# Patient Record
Sex: Female | Born: 1970 | Race: White | Hispanic: No | Marital: Married | Smoking: Never smoker
Health system: Southern US, Community
[De-identification: ages and names within clinical notes are randomized; demographics above are authoritative.]

---

## 2012-06-15 ENCOUNTER — Ambulatory Visit: Payer: Self-pay | Admitting: Family Medicine

## 2013-08-12 ENCOUNTER — Ambulatory Visit: Payer: Self-pay | Admitting: Internal Medicine

## 2014-08-28 ENCOUNTER — Ambulatory Visit
Admission: RE | Admit: 2014-08-28 | Discharge: 2014-08-28 | Disposition: A | Discharge status: Home / Self Care | Payer: BLUE CROSS/BLUE SHIELD | Source: Ambulatory Visit | Attending: Family Medicine | Admitting: Family Medicine

## 2014-08-28 ENCOUNTER — Other Ambulatory Visit: Payer: Self-pay | Admitting: Family Medicine

## 2014-08-28 DIAGNOSIS — M79671 Pain in right foot: Secondary | ICD-10-CM | POA: Diagnosis present

## 2014-08-28 DIAGNOSIS — M79674 Pain in right toe(s): Secondary | ICD-10-CM

## 2014-12-01 ENCOUNTER — Other Ambulatory Visit
Admission: RE | Admit: 2014-12-01 | Discharge: 2014-12-01 | Disposition: A | Discharge status: Home / Self Care | Payer: BLUE CROSS/BLUE SHIELD | Source: Ambulatory Visit | Attending: Gastroenterology | Admitting: Gastroenterology

## 2014-12-29 ENCOUNTER — Ambulatory Visit
Admission: RE | Admit: 2014-12-29 | Payer: BLUE CROSS/BLUE SHIELD | Source: Ambulatory Visit | Admitting: Gastroenterology

## 2014-12-29 ENCOUNTER — Encounter: Admission: RE | Payer: Self-pay | Source: Ambulatory Visit

## 2014-12-29 SURGERY — ESOPHAGOGASTRODUODENOSCOPY (EGD) WITH PROPOFOL
Anesthesia: General

## 2015-01-02 ENCOUNTER — Encounter: Payer: Self-pay | Admitting: Sports Medicine

## 2015-01-02 ENCOUNTER — Ambulatory Visit (INDEPENDENT_AMBULATORY_CARE_PROVIDER_SITE_OTHER): Payer: BLUE CROSS/BLUE SHIELD | Admitting: Sports Medicine

## 2015-01-02 DIAGNOSIS — M2021 Hallux rigidus, right foot: Secondary | ICD-10-CM

## 2015-01-02 DIAGNOSIS — M79672 Pain in left foot: Secondary | ICD-10-CM

## 2015-01-02 DIAGNOSIS — M79671 Pain in right foot: Secondary | ICD-10-CM

## 2015-01-02 DIAGNOSIS — M2022 Hallux rigidus, left foot: Secondary | ICD-10-CM | POA: Diagnosis not present

## 2015-01-02 MED ORDER — TRIAMCINOLONE ACETONIDE 10 MG/ML IJ SUSP: 10.0000 mg | Freq: Once | INTRAMUSCULAR | Status: AC

## 2015-01-02 MED ORDER — MELOXICAM 15 MG PO TABS: 15.0000 mg | ORAL_TABLET | Freq: Every day | ORAL | Status: AC

## 2015-01-02 NOTE — Progress Notes (Signed)
Patient ID: Stephanie Robinson, female   DOB: 1970/12/14, 44 y.o.   MRN: [id number] Subjective: Stephanie Robinson is a 44 y.o. female patient who presents to office for evaluation of Right> Left foot pain. Patient complains of progressive pain especially over the last 5 years in the Right>Left foot at the big toe joint that radiates to the top and sometimes swell and turns red.  Patient has tried change in shoes with no relief in symptoms. Patient denies any other pedal complaints. Denies injury/trip/fall/sprain/any causative factors.   Review of Systems  All other systems reviewed and are negative.  There are no active problems to display for this patient.  No current outpatient prescriptions on file prior to visit.   No current facility-administered medications on file prior to visit.   Allergies  Allergen Reactions  . Peanut Oil Shortness Of Breath and Anaphylaxis    Chest pain  . Allyl Isothiocyanate Other (See Comments)  . Meperidine     Other reaction(s): SHORTNESS OF BREATH  . Wheat Bran Other (See Comments)  . Aspirin Rash and Other (See Comments)     Objective:  General: Alert and oriented x3 in no acute distress  Dermatology: No open lesions bilateral lower extremities, no webspace macerations, no ecchymosis bilateral, all nails x 10 are well manicured.  Vascular: Dorsalis Pedis and Posterior Tibial pedal pulses 2/4, Capillary Fill Time 3 seconds,(+) pedal hair growth bilateral, no erythema, no edema bilateral lower extremities, Temperature gradient within normal limits.  Neurology: Gross sensation intact via light touch bilateral. No Tinels sign.   Musculoskeletal: Mild tenderness with palpation at 1st MTPJ and anterior joint line on Right>Left with 10 deg df and pf on right and 25 deg df and 10 deg pf on left, no crepitus, all other joints within normal limits. No pain with calf compression bilateral.Strength within normal limits in all groups bilateral.    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 If works well will make orthotics.  -Patient to return to office in 4 weeks  or sooner if condition worsens.  Landis Martins, DPM

## 2015-01-02 NOTE — Patient Instructions (Signed)
Hallux Rigidus Hallux rigidus is a condition involving pain and a loss of motion of the first (big) toe. The pain gets worse with lifting up (extension) of the toe. This is usually due to arthritic bony bumps (spurring) of the joint at the base of the big toe.  SYMPTOMS   Pain, with lifting up of the toe.  Tenderness over the joint where the big toe meets the foot.  Redness, swelling, and warmth over the top of the base of the big toe (sometimes).  Foot pain, stiffness, and limping. CAUSES  Hallux rigidus is caused by arthritis of the joint where the big toe meets the foot. The arthritis creates a bone spur that pinches the soft tissues when the toe is extended. RISK INCREASES WITH:  Tight shoes with a narrow toe box.  Family history of foot problems.  Gout and rheumatoid and psoriatic arthritis.  History of previous toe injury, including "turf toe."  Long first toe, flat feet, and other big toe bony bumps.  Arthritis of the big toe. PREVENTION   Wear wide-toed shoes that fit well.  Tape the big toe to reduce motion and to prevent pinching of the tissues between the bone.  Maintain physical fitness:  Foot and ankle flexibility.  Muscle strength and endurance. PROGNOSIS  This condition can usually be managed with proper treatment. However, surgery is typically required to prevent the problem from recurring.  RELATED COMPLICATIONS  Injury to other areas of the foot or ankle, caused by abnormal walking in an attempt to avoid the pain felt when walking normally. TREATMENT Treatment first involves stopping the activities that aggravate your symptoms. Ice and medicine can be used to reduce the pain and inflammation. Modifications to shoes may help reduce pain, including wearing stiff-soled shoes, shoes with a wide toe box, inserting a padded donut to relieve pressure on top of the joint, or wearing an arch support. Corticosteroid injections may be given to reduce inflammation. If  nonsurgical treatment is unsuccessful, surgery may be needed. Surgical options include removing the arthritic bony spur, cutting a bone in the foot to change the arc of motion (allowing the toe to extend more), or fusion of the joint (eliminating all motion in the joint at the base of the big toe).  MEDICATION   If pain medicine is needed, nonsteroidal anti-inflammatory medicines (aspirin and ibuprofen), or other minor pain relievers (acetaminophen), are often advised.  Do not take pain medicine for 7 days before surgery.  Prescription pain relievers are usually prescribed only after surgery. Use only as directed and only as much as you need.  Ointments for arthritis, applied to the skin, may give some relief.  Injections of corticosteroids may be given to reduce inflammation. HEAT AND COLD  Cold treatment (icing) relieves pain and reduces inflammation. Cold treatment should be applied for 10 to 15 minutes every 2 to 3 hours, and immediately after activity that aggravates your symptoms. Use ice packs or an ice massage.  Heat treatment may be used before performing the stretching and strengthening activities prescribed by your caregiver, physical therapist, or athletic trainer. Use a heat pack or a warm water soak. SEEK MEDICAL CARE IF:   Symptoms get worse or do not improve in 2 weeks, despite treatment.  After surgery you develop fever, increasing pain, redness, swelling, drainage of fluids, bleeding, or increasing warmth.  New, unexplained symptoms develop. (Drugs used in treatment may produce side effects.)   This information is not intended to replace advice given to  you by your health care provider. Make sure you discuss any questions you have with your health care provider.   Document Released: 12/30/2004 Document Revised: 01/20/2014 Document Reviewed: 04/13/2008 Elsevier Interactive Patient Education [employer].

## 2015-01-02 NOTE — Progress Notes (Deleted)
   Subjective:    Patient ID: Stephanie Robinson, female    DOB: 1970-06-17, 44 y.o.   MRN: [id number]  HPI    Review of Systems  All other systems reviewed and are negative.      Objective:   Physical Exam        Assessment & Plan:

## 2015-01-30 ENCOUNTER — Ambulatory Visit (INDEPENDENT_AMBULATORY_CARE_PROVIDER_SITE_OTHER): Payer: Managed Care, Other (non HMO) | Admitting: Sports Medicine

## 2015-01-30 ENCOUNTER — Encounter: Payer: Self-pay | Admitting: Sports Medicine

## 2015-01-30 DIAGNOSIS — M79671 Pain in right foot: Secondary | ICD-10-CM | POA: Diagnosis not present

## 2015-01-30 DIAGNOSIS — M2021 Hallux rigidus, right foot: Secondary | ICD-10-CM | POA: Diagnosis not present

## 2015-01-30 DIAGNOSIS — M2022 Hallux rigidus, left foot: Secondary | ICD-10-CM

## 2015-01-30 DIAGNOSIS — M79672 Pain in left foot: Secondary | ICD-10-CM

## 2015-01-30 NOTE — Progress Notes (Signed)
Patient ID: Stephanie Robinson, female   DOB: 1970-09-30, 45 y.o.   MRN: [id number]  Subjective: Stephanie Robinson is a 45 y.o. female patient who returns to office for follow up evaluation of Right> Left foot pain. Patient states the injection helped a lot and her pain feels all gone in the right side. Patient reports that the padding was also helpful. Still has another week of Meloxicam to finish. Patient denies any other pedal complaints. .   There are no active problems to display for this patient.  Current Outpatient Prescriptions on File Prior to Visit  Medication Sig Dispense Refill  . Cholecalciferol (VITAMIN D) 2000 UNITS tablet Take 2,000 Units by mouth daily.    . meloxicam (MOBIC) 15 MG tablet Take 1 tablet (15 mg total) by mouth daily. 30 tablet 0  . sertraline (ZOLOFT) 100 MG tablet Take 200 mg by mouth daily.     Current Facility-Administered Medications on File Prior to Visit  Medication Dose Route Frequency Provider Last Rate Last Dose  . triamcinolone acetonide (KENALOG) 10 MG/ML injection 10 mg  10 mg Other Once Landis Martins, DPM       Allergies  Allergen Reactions  . Peanut Oil Shortness Of Breath and Anaphylaxis    Chest pain  . Allyl Isothiocyanate Other (See Comments)  . Meperidine     Other reaction(s): SHORTNESS OF BREATH  . Wheat Bran Other (See Comments)  . Aspirin Rash and Other (See Comments)     Objective:  General: Alert and oriented x3 in no acute distress  Dermatology: No open lesions bilateral lower extremities, no webspace macerations, no ecchymosis bilateral, all nails x 10 are well manicured.  Vascular: Dorsalis Pedis and Posterior Tibial pedal pulses 2/4, Capillary Fill Time 3 seconds,(+) pedal hair growth bilateral, no erythema, no edema bilateral lower extremities, Temperature gradient within normal limits.  Neurology: Gross sensation intact via light touch bilateral. No Tinels sign.   Musculoskeletal: No tenderness with palpation at 1st  MTPJ and anterior joint line on Right>Left with 10 deg df and pf on right and 25 deg df and 10 deg pf on left, no crepitus, all other joints within normal limits. No pain with calf compression bilateral.Strength within normal limits in all groups bilateral.    Assessment and Plan: Problem List Items Addressed This Visit    None    Visit Diagnoses    Foot pain, bilateral    -  Primary    Hallux rigidus of both feet        R>L, much improved       -Complete examination performed. -Discussed treatement options for hallux rigidus and long term care. -Patient to complete Meloxicam 15 mg po daily with food as Rx. -Advised patient to monitor symptoms if recur to return for re-eval. -Recommend ice and elevation as needed. -Recommended good supportive shoes and OTC inserts. If fail to resolve symptoms will make custom orthotics for patient.  -Patient to return to office as needed or sooner if condition worsens.  Landis Martins, DPM

## 2015-05-29 ENCOUNTER — Other Ambulatory Visit: Payer: Self-pay | Admitting: Family Medicine

## 2015-05-29 DIAGNOSIS — R1011 Right upper quadrant pain: Secondary | ICD-10-CM

## 2015-05-31 ENCOUNTER — Ambulatory Visit
Admission: RE | Admit: 2015-05-31 | Discharge: 2015-05-31 | Disposition: A | Discharge status: Home / Self Care | Payer: Managed Care, Other (non HMO) | Source: Ambulatory Visit | Attending: Family Medicine | Admitting: Family Medicine

## 2015-05-31 DIAGNOSIS — R1011 Right upper quadrant pain: Secondary | ICD-10-CM

## 2015-05-31 DIAGNOSIS — K760 Fatty (change of) liver, not elsewhere classified: Secondary | ICD-10-CM | POA: Insufficient documentation

## 2019-04-08 ENCOUNTER — Ambulatory Visit: Payer: BLUE CROSS/BLUE SHIELD | Attending: Internal Medicine

## 2019-04-08 DIAGNOSIS — Z23 Encounter for immunization: Secondary | ICD-10-CM

## 2019-04-08 NOTE — Progress Notes (Signed)
   Covid-19 Vaccination Clinic  Name:  Alyxandra Tenbrink    MRN: [id number] DOB: 1970-08-13  04/08/2019  Ms. Buchta was observed post Covid-19 immunization for 15 minutes without incident. She was provided with Vaccine Information Sheet and instruction to access the V-Safe system.   Ms. Cansler was instructed to call 911 with any severe reactions post vaccine: Marland Kitchen Difficulty breathing  . Swelling of face and throat  . A fast heartbeat  . A bad rash all over body  . Dizziness and weakness   Immunizations Administered    Name Date Dose VIS Date Route   Pfizer COVID-19 Vaccine 04/08/2019  8:24 AM 0.3 mL 12/24/2018 Intramuscular   Manufacturer: [place]   Lot: [id number]   [place]: [id number]

## 2019-05-03 ENCOUNTER — Ambulatory Visit: Payer: BLUE CROSS/BLUE SHIELD | Attending: Internal Medicine

## 2019-05-03 DIAGNOSIS — Z23 Encounter for immunization: Secondary | ICD-10-CM

## 2019-05-03 NOTE — Progress Notes (Signed)
   Covid-19 Vaccination Clinic  Name:  Stephanie Robinson    MRN: [id number] DOB: 02-16-1970  05/03/2019  Stephanie Robinson was observed post Covid-19 immunization for 30 minutes based on pre-vaccination screening without incident. She was provided with Vaccine Information Sheet and instruction to access the V-Safe system.   Stephanie Robinson was instructed to call 911 with any severe reactions post vaccine: Marland Kitchen Difficulty breathing  . Swelling of face and throat  . A fast heartbeat  . A bad rash all over body  . Dizziness and weakness   Immunizations Administered    Name Date Dose VIS Date Route   Pfizer COVID-19 Vaccine 05/03/2019  8:45 AM 0.3 mL 03/09/2018 Intramuscular   Manufacturer: [employer]   Lot: [id number]   [place]: [address]-1

## 2020-07-27 ENCOUNTER — Emergency Department: Payer: Managed Care, Other (non HMO)

## 2020-07-27 ENCOUNTER — Other Ambulatory Visit: Payer: Self-pay

## 2020-07-27 ENCOUNTER — Emergency Department
Admission: EM | Admit: 2020-07-27 | Discharge: 2020-07-27 | Disposition: A | Discharge status: Home / Self Care | Payer: Managed Care, Other (non HMO) | Attending: Emergency Medicine | Admitting: Emergency Medicine

## 2020-07-27 DIAGNOSIS — Y92003 Bedroom of unspecified non-institutional (private) residence as the place of occurrence of the external cause: Secondary | ICD-10-CM | POA: Insufficient documentation

## 2020-07-27 DIAGNOSIS — S8391XA Sprain of unspecified site of right knee, initial encounter: Secondary | ICD-10-CM | POA: Insufficient documentation

## 2020-07-27 DIAGNOSIS — X501XXA Overexertion from prolonged static or awkward postures, initial encounter: Secondary | ICD-10-CM | POA: Insufficient documentation

## 2020-07-27 DIAGNOSIS — M25561 Pain in right knee: Secondary | ICD-10-CM | POA: Insufficient documentation

## 2020-07-27 DIAGNOSIS — S8991XA Unspecified injury of right lower leg, initial encounter: Secondary | ICD-10-CM | POA: Diagnosis present

## 2020-07-27 MED ORDER — IBUPROFEN 800 MG PO TABS: 800.0000 mg | ORAL_TABLET | Freq: Three times a day (TID) | ORAL | 0 refills | Status: AC | PRN

## 2020-07-27 MED ORDER — IBUPROFEN 800 MG PO TABS
800.0000 mg | ORAL_TABLET | Freq: Once | ORAL | Status: AC
  Administered 2020-07-27: 800 mg via ORAL
  Filled 2020-07-27: qty 1

## 2020-07-27 NOTE — ED Triage Notes (Signed)
Pt states her knee "popped" out of joint tonight, hx of the same but it usually resolves on its own. Tonight was laying in bed and rolled over, EMS gave fentanyl and splinted left leg.

## 2020-07-27 NOTE — ED Provider Notes (Signed)
[facility] Emergency Department Provider Note  ____________________________________________  Time seen: Approximately 2:44 AM  I have reviewed the triage vital signs and the nursing notes.   HISTORY  Chief Complaint Knee Injury   HPI Stephanie Robinson is a 50 y.o. female with a history of right meniscus tear status postsurgical repair who presents for evaluation of right knee pain.  Patient reports that she was turning in bed this evening when she felt something pop in her knee.  She looked at it and it looked abnormal and swollen.  She had severe pain and was not able to bear weight.  She does report pops and clicks that are painful in her knee but those usually resolve within a few minutes.  She received IV fentanyl for per EMS and reports that her pain is mostly resolved at this time.  PMH R knee meniscus tear  Prior to Admission medications   Medication Sig Start Date End Date Taking? Authorizing Provider  ibuprofen (ADVIL) 800 MG tablet Take 1 tablet (800 mg total) by mouth every 8 (eight) hours as needed. 07/27/20  Yes Aryonna Gunnerson, [place], MD  Cholecalciferol (VITAMIN D) 2000 UNITS tablet Take 2,000 Units by mouth daily.    [provider]  meloxicam (MOBIC) 15 MG tablet Take 1 tablet (15 mg total) by mouth daily. 01/02/15   Landis Martins, DPM  sertraline (ZOLOFT) 100 MG tablet Take 200 mg by mouth daily.    [provider]    Allergies Peanut oil, Allyl isothiocyanate, Meperidine, Sulfa antibiotics, Wheat bran, and Aspirin  No family history on file.  Social History Social History   Tobacco Use   Smoking status: Never   Smokeless tobacco: Never    Review of Systems  Constitutional: Negative for fever. Eyes: Negative for visual changes. ENT: Negative for sore throat. Neck: No neck pain  Cardiovascular: Negative for chest pain. Respiratory: Negative for shortness of breath. Gastrointestinal: Negative for abdominal  pain, vomiting or diarrhea. Genitourinary: Negative for dysuria. Musculoskeletal: Negative for back pain. + R knee pain Skin: Negative for rash. Neurological: Negative for headaches, weakness or numbness. Psych: No SI or HI  ____________________________________________   PHYSICAL EXAM:  VITAL SIGNS: ED Triage Vitals  Enc Vitals Group     BP 07/27/20 0109 114/69     Pulse Rate 07/27/20 0109 65     Resp 07/27/20 0109 20     Temp 07/27/20 0109 98.3 F (36.8 C)     Temp Source 07/27/20 0109 Oral     SpO2 07/27/20 0109 98 %     Weight 07/27/20 0105 230 lb (104.3 kg)     Height 07/27/20 0105 5' 6"  (1.676 m)     Head Circumference --      Peak Flow --      Pain Score 07/27/20 0105 1     Pain Loc --      Pain Edu? --      Excl. in [place]? --     Constitutional: Alert and oriented. Well appearing and in no apparent distress. HEENT:      Head: Normocephalic and atraumatic.         Eyes: Conjunctivae are normal. Sclera is non-icteric.       Mouth/Throat: Mucous membranes are moist.       Neck: Supple with no signs of meningismus. Cardiovascular: Regular rate and rhythm.  Respiratory: Normal respiratory effort.  Musculoskeletal:  No edema, cyanosis, or erythema of extremities.  Patellas in the right location,  no abnormalities or deformities, full painless range of motion, strong distal pulses Neurologic: Normal speech and language. Face is symmetric. Moving all extremities. No gross focal neurologic deficits are appreciated. Skin: Skin is warm, dry and intact. No rash noted. Psychiatric: Mood and affect are normal. Speech and behavior are normal.  ____________________________________________   LABS (all labs ordered are listed, but only abnormal results are displayed)  Labs Reviewed - No data to display ____________________________________________  EKG  none  ____________________________________________  RADIOLOGY  I have personally reviewed the images performed during  this visit and I agree with the Radiologist's read.   Interpretation by Radiologist:  DG Knee Complete 4 Views Left  Result Date: 07/27/2020 CLINICAL DATA:  History of dislocation earlier this evening with relocation, initial encounter EXAM: LEFT KNEE - COMPLETE 4+ VIEW COMPARISON:  None. FINDINGS: Tricompartmental degenerative changes are noted. No acute fracture or dislocation is seen. No soft tissue abnormality is noted. No joint effusion is seen. IMPRESSION: Degenerative change without acute abnormality. Electronically Signed   By: Inez Catalina M.D.   On: 07/27/2020 01:46     ____________________________________________   PROCEDURES  Procedure(s) performed: None Procedures Critical Care performed:  None ____________________________________________   INITIAL IMPRESSION / ASSESSMENT AND PLAN / ED COURSE  50 y.o. female with a history of right meniscus tear status postsurgical repair who presents for evaluation of right knee pain.  Normal exam at this time with no deformities and full painless range of motion.  X-ray showing degenerative changes with no dislocations or fractures.  Possible mild sprain versus patellar dislocation which relocated before arrival to the ED.  Ace wrap was applied, patient given ice pack, crutches, ibuprofen 800 mg.  Will refer to orthopedics for further evaluation as needed.  RICE therapy discussed.  Discussed my standard return precautions with patient and her husband was at bedside      _____________________________________________ Please note:  Patient was evaluated in Emergency Department today for the symptoms described in the history of present illness. Patient was evaluated in the context of the global COVID-19 pandemic, which necessitated consideration that the patient might be at risk for infection with the SARS-CoV-2 virus that causes COVID-19. Institutional protocols and algorithms that pertain to the evaluation of patients at risk for COVID-19 are  in a state of rapid change based on information released by regulatory bodies including the CDC and federal and state organizations. These policies and algorithms were followed during the patient's care in the ED.  Some ED evaluations and interventions may be delayed as a result of limited staffing during the pandemic.    Controlled Substance Database was reviewed by me. ____________________________________________   FINAL CLINICAL IMPRESSION(S) / ED DIAGNOSES   Final diagnoses:  Sprain of right knee, unspecified ligament, initial encounter      NEW MEDICATIONS STARTED DURING THIS VISIT:  ED Discharge Orders          Ordered    ibuprofen (ADVIL) 800 MG tablet  Every 8 hours PRN        07/27/20 0244             Note:  This document was prepared using Dragon voice recognition software and may include unintentional dictation errors.    Rudene Re, MD 07/27/20 [phone]

## 2020-07-27 NOTE — ED Notes (Signed)
ED Provider at bedside. 

## 2021-05-23 ENCOUNTER — Other Ambulatory Visit: Payer: Self-pay | Admitting: Family Medicine

## 2021-05-23 DIAGNOSIS — R1011 Right upper quadrant pain: Secondary | ICD-10-CM

## 2021-06-03 ENCOUNTER — Ambulatory Visit
Admission: RE | Admit: 2021-06-03 | Discharge: 2021-06-03 | Disposition: A | Discharge status: Home / Self Care | Payer: Managed Care, Other (non HMO) | Source: Ambulatory Visit | Attending: Family Medicine | Admitting: Family Medicine

## 2021-06-03 DIAGNOSIS — R1011 Right upper quadrant pain: Secondary | ICD-10-CM | POA: Insufficient documentation

## 2022-10-14 IMAGING — US US ABDOMEN LIMITED
1 series · 14 of 25 positions shown · non-contrast
Comparison: Abdominal ultrasound 05/31/2015

CLINICAL DATA: Right upper quadrant pain

EXAM:
ULTRASOUND ABDOMEN LIMITED RIGHT UPPER QUADRANT

[Series 1: us abdomen limited · 0.26mm/px · 14 of 43 slices shown]
[im 1/43]
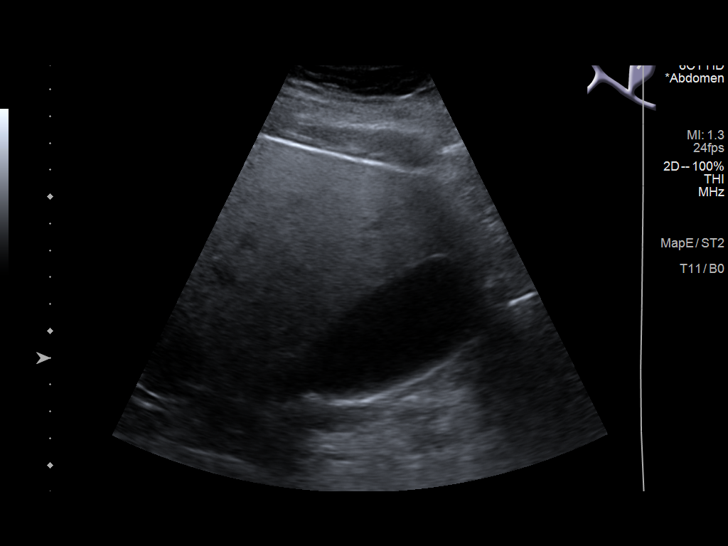
[im 4/43]
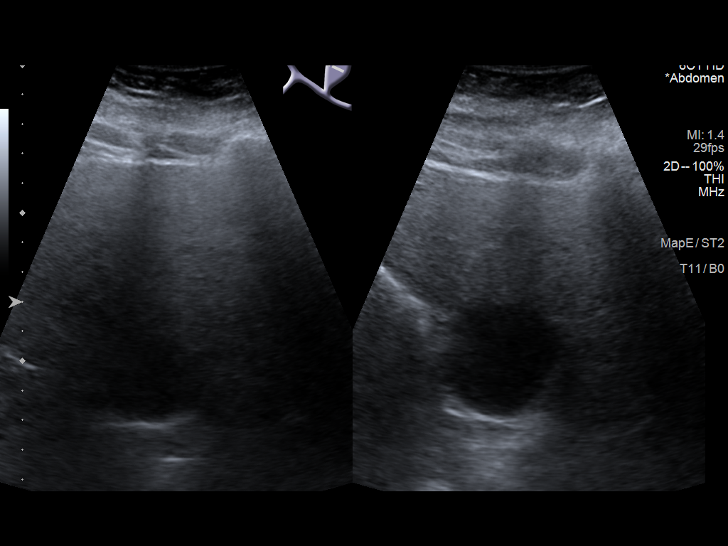
[im 8/43]
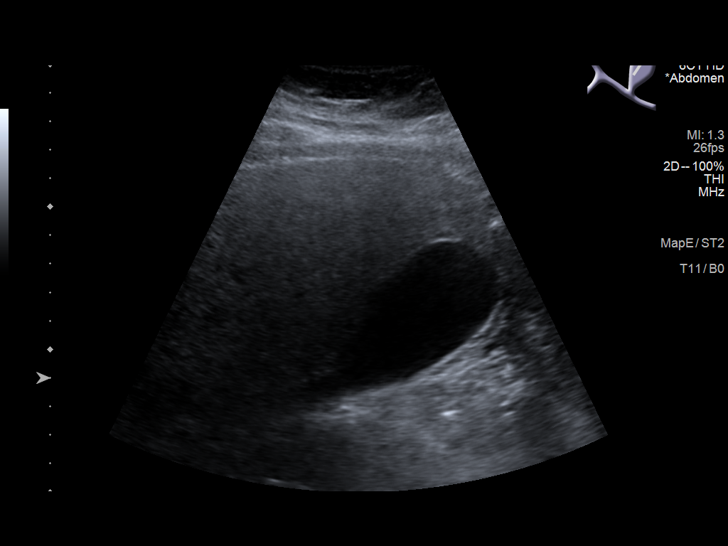
[im 11/43]
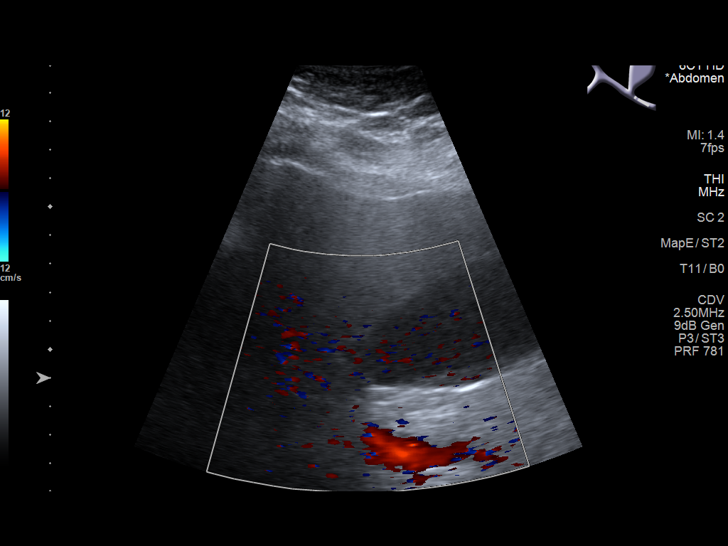
[im 15/43]
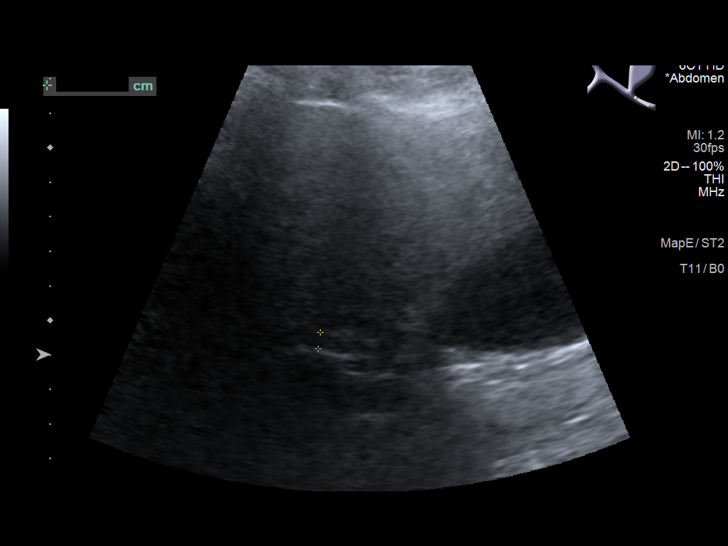
[im 16/43]
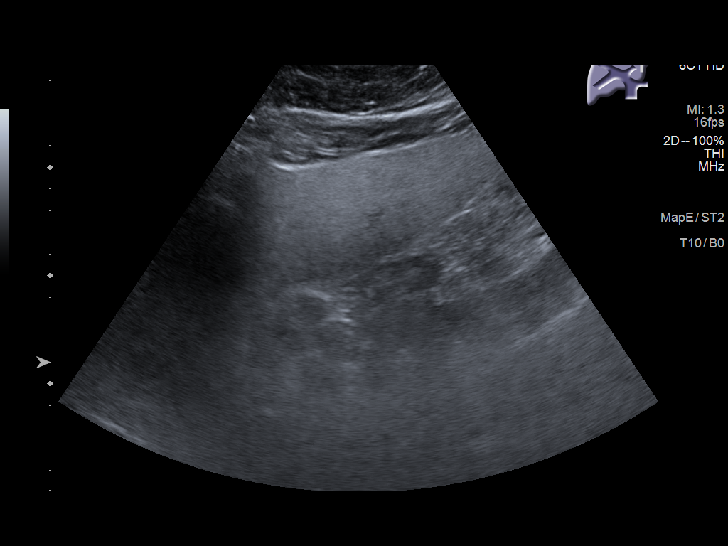
[im 20/43]
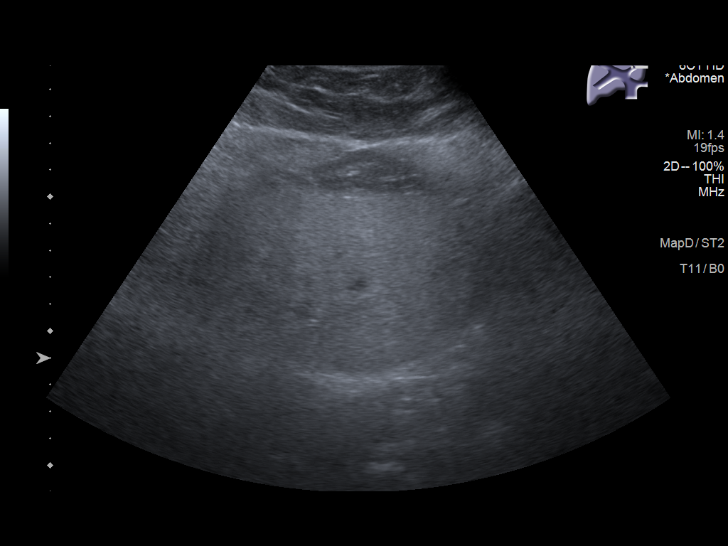
[im 23/43]
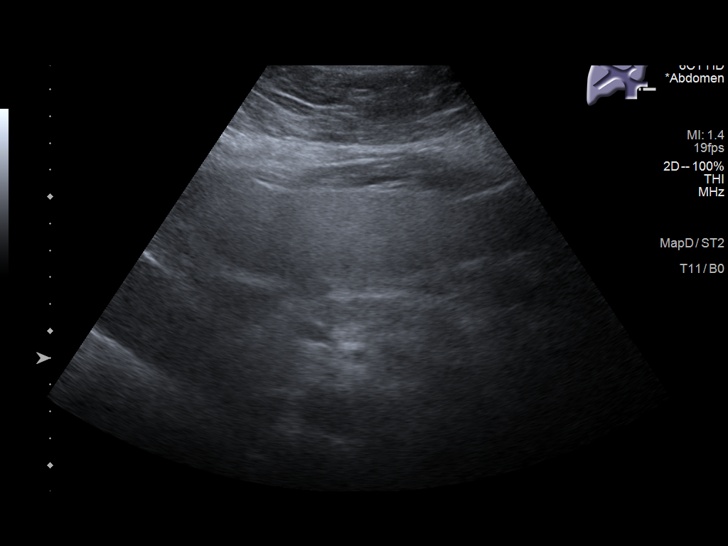
[im 27/43]
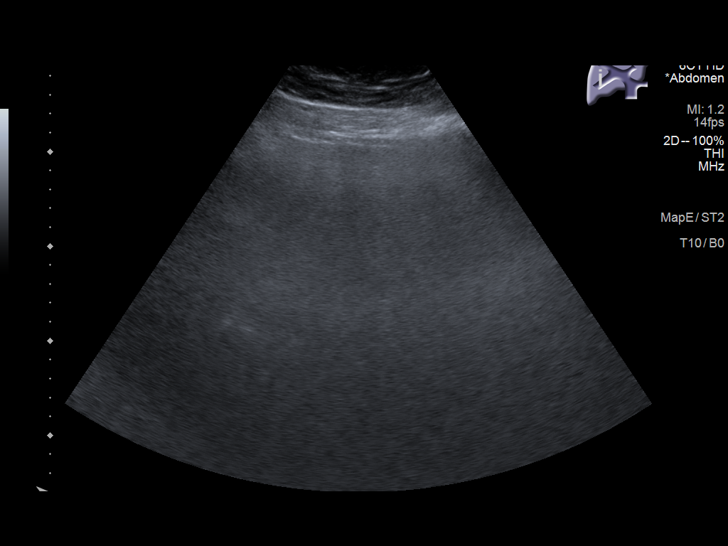
[im 29/43]
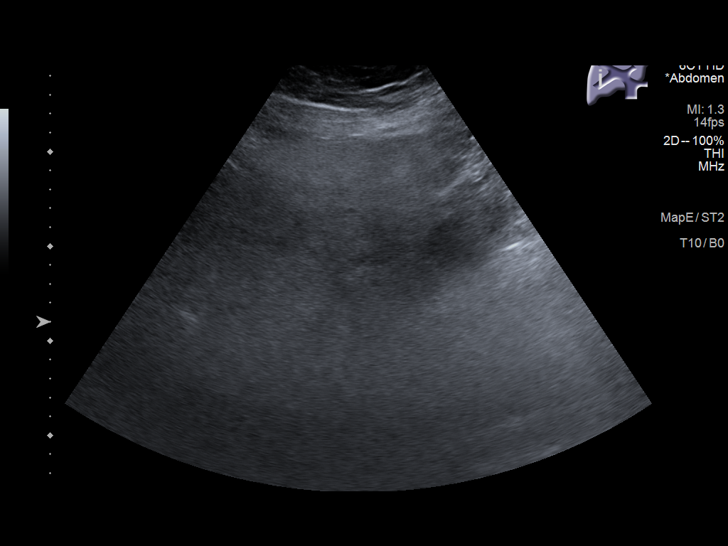
[im 32/43]
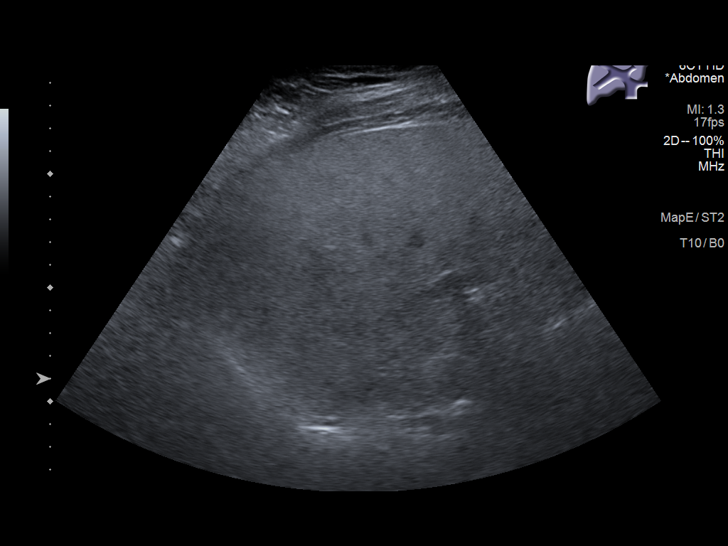
[im 36/43]
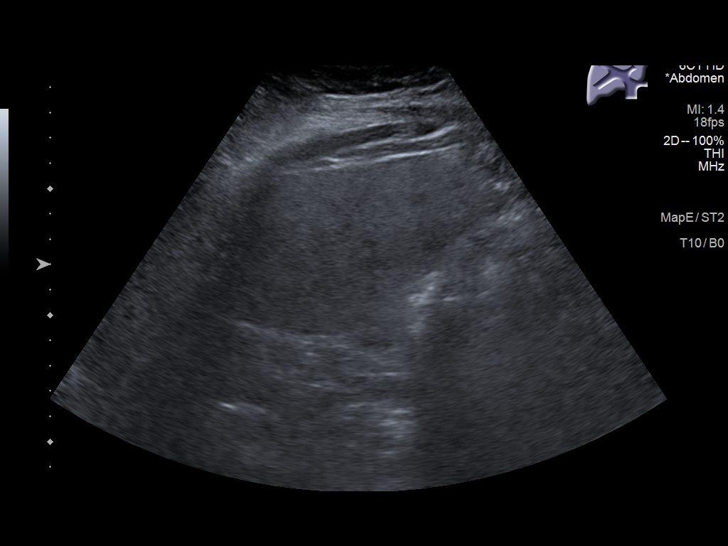
[im 39/43]
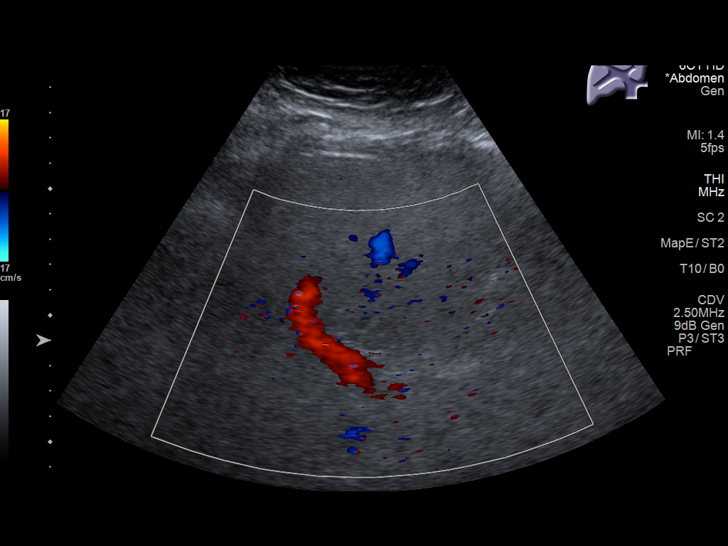
[im 43/43]
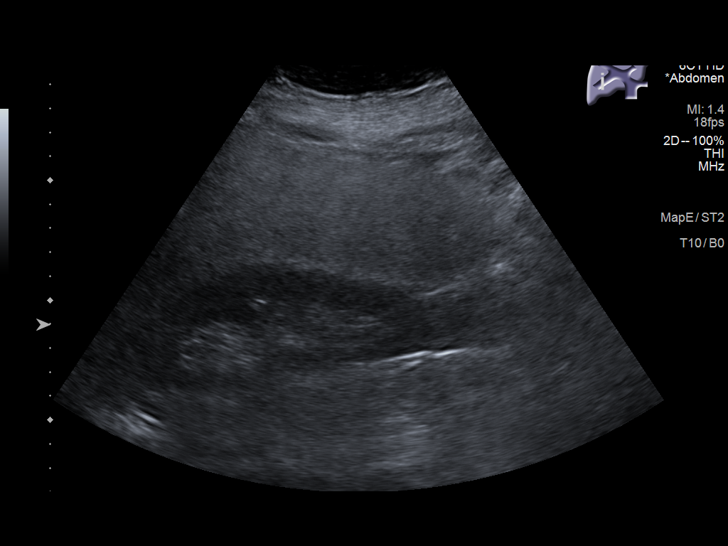

[14 of 25 positions shown; findings below may reference images not displayed]

FINDINGS: Gallbladder:

No gallstones or wall thickening visualized. No sonographic Murphy
sign noted by sonographer.

Common bile duct:

Diameter: 5 mm

Liver:

Coarse, increased echogenicity of the liver parenchyma with no focal
mass identified. Portal vein is patent on color Doppler imaging with
normal direction of blood flow towards the liver.

Other: None.
IMPRESSION: Coarse, increased echogenicity of the liver parenchyma which may
represent hepatic steatosis and/or other hepatocellular disease.
